# Patient Record
Sex: Male | Born: 1980 | Marital: Married | Smoking: Never smoker
Health system: Southern US, Community
[De-identification: ages and names within clinical notes are randomized; demographics above are authoritative.]

## PROBLEM LIST (undated history)

## (undated) HISTORY — PX: KNEE ARTHROSCOPY W/ ACL RECONSTRUCTION: SHX1858

## (undated) HISTORY — PX: SKIN SURGERY: SHX2413

---

## 2007-03-16 ENCOUNTER — Ambulatory Visit: Payer: Self-pay | Admitting: Unknown Physician Specialty

## 2009-02-24 IMAGING — RF DG UGI W/ KUB
1 series · 15 of 24 positions shown · non-contrast
Comparison: none

REASON FOR EXAM: gas    reflux maneuvers
COMMENTS:

[Series 1: run · 8 acquisitions, 15 frames shown]
[im 1/8]
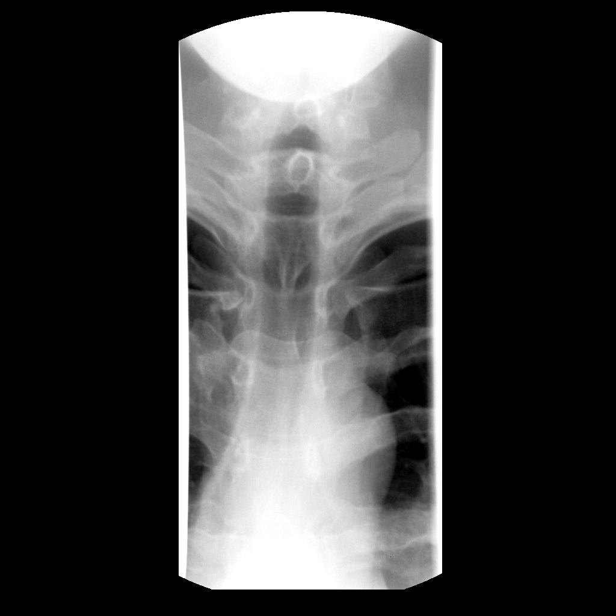
[im 1/8]
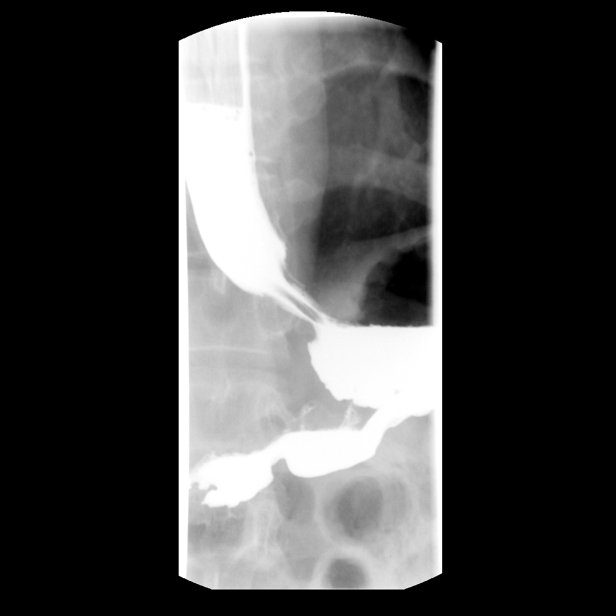
[im 2/8]
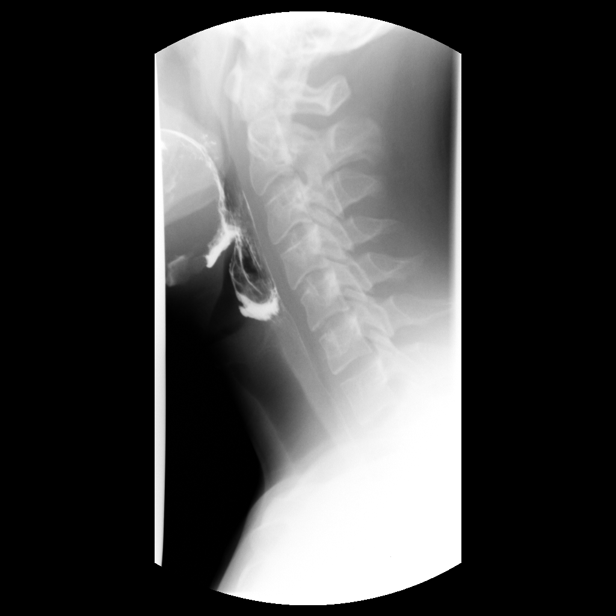
[im 2/8]
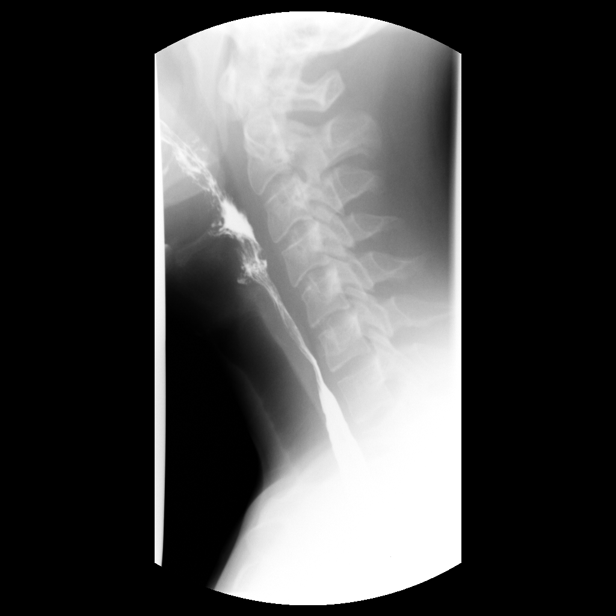
[im 3/8]
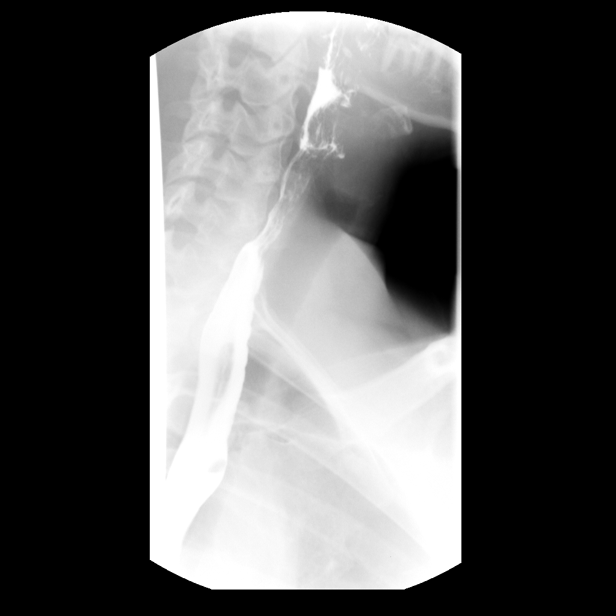
[im 3/8]
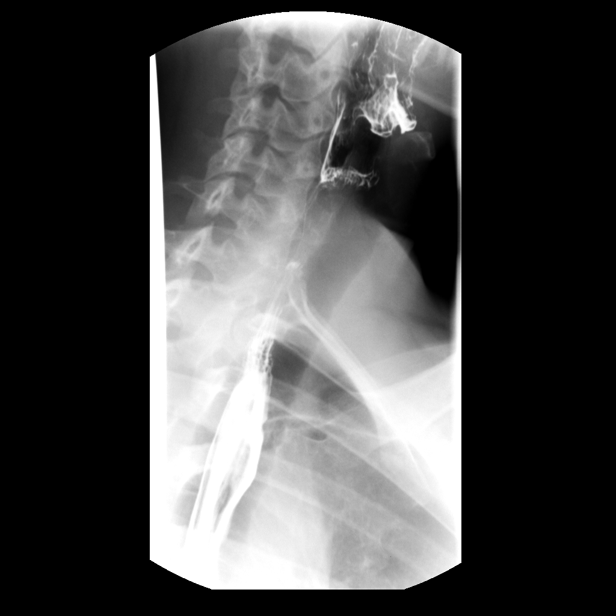
[im 4/8]
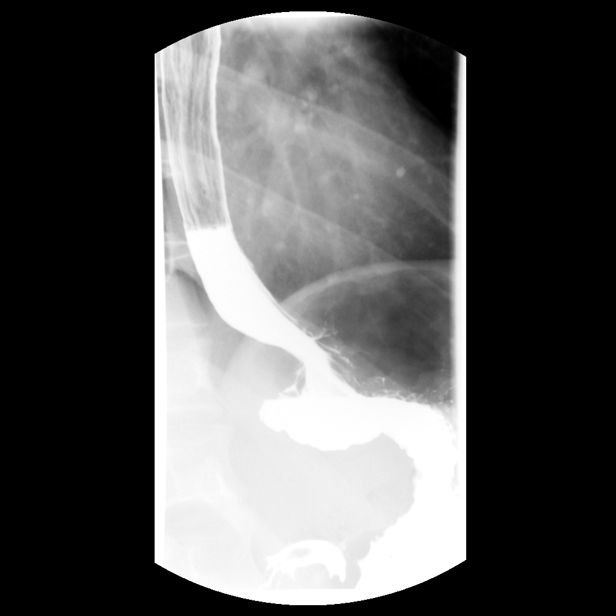
[im 5/8]
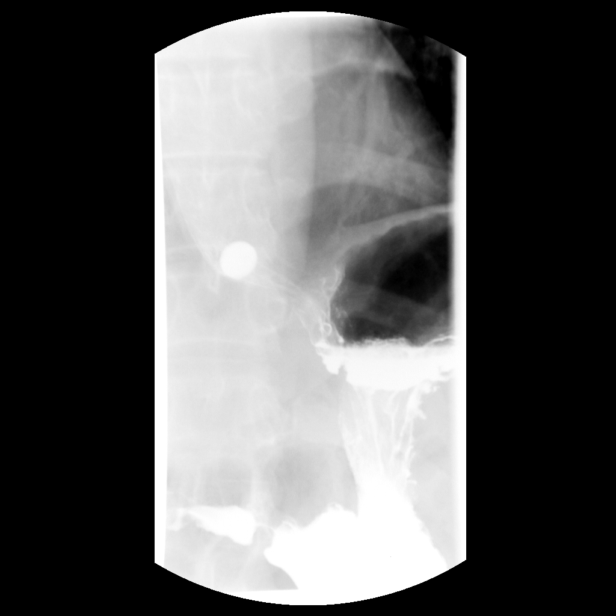
[im 5/8]
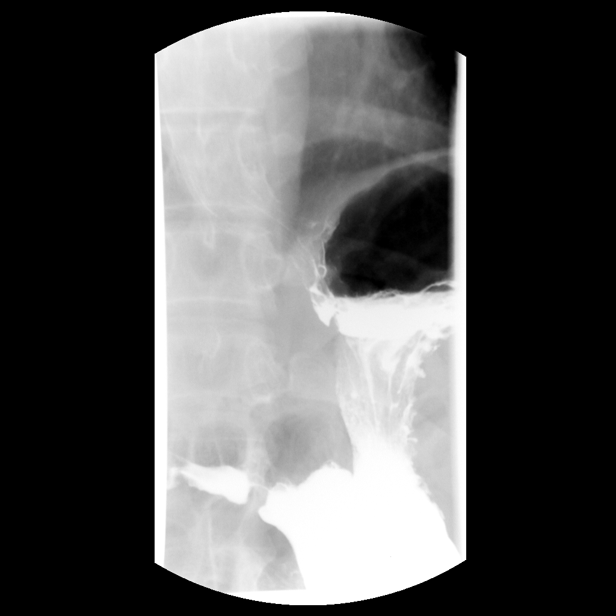
[im 6/8]
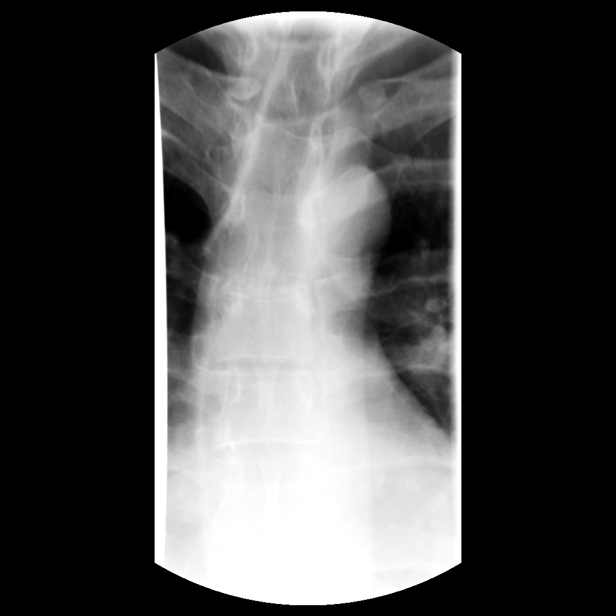
[im 6/8]
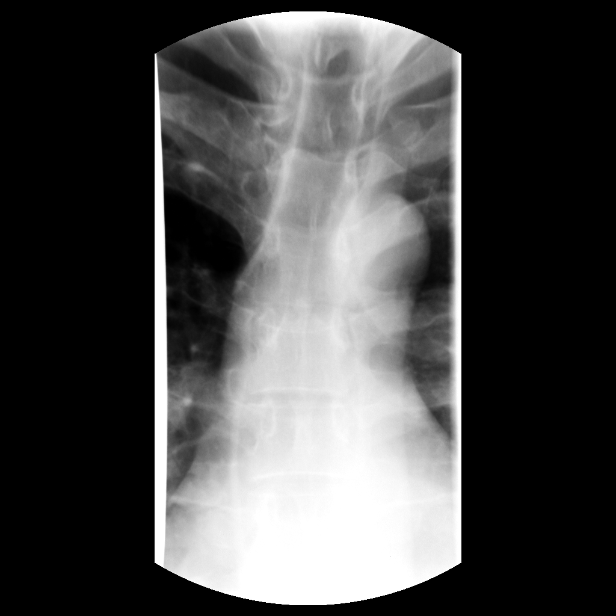
[im 7/8]
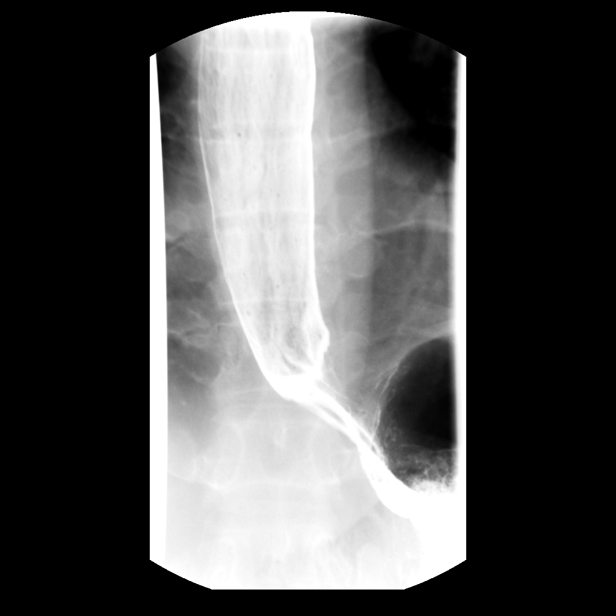
[im 7/8]
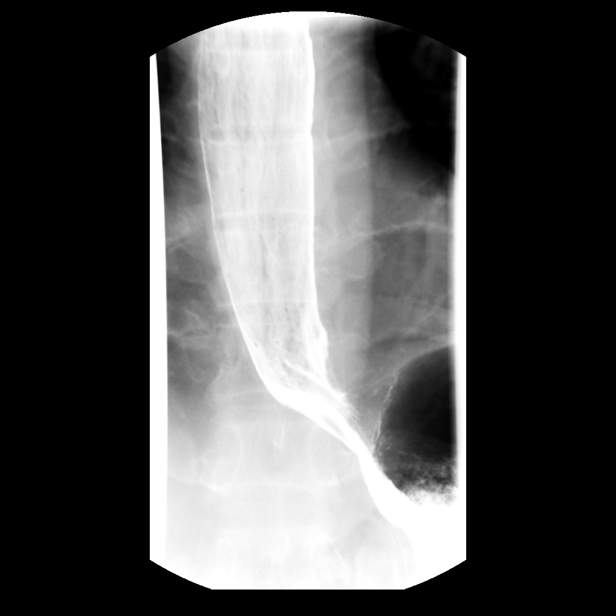
[im 8/8]
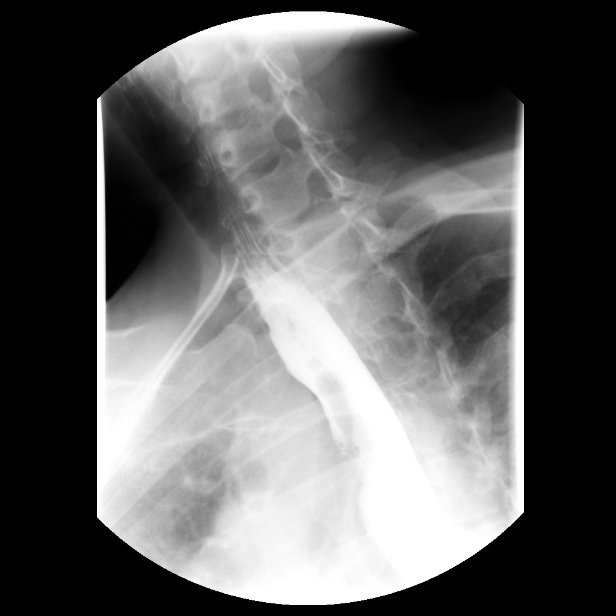
[im 8/8]
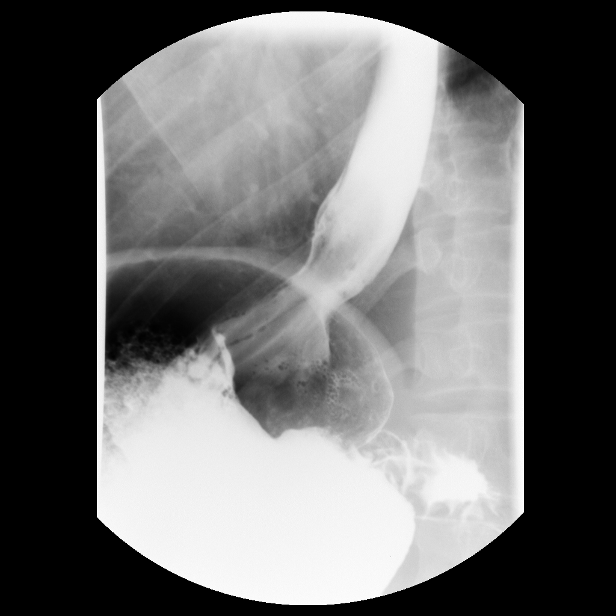

[15 of 24 positions shown; findings below may reference images not displayed]

PROCEDURE:     FL  - FL UPPER GI W/ BARIUM SWALLOW  - March 16, 2007  [DATE]

RESULT:     Routine Upper GI with barium swallow double-contrast examination
shows normal mucosal appearance of the esophagus with a normal morphology.
There is no gastroesophageal reflux. There is no persistent stenosis. A
mm barium impregnated tablet passed easily through the esophagus into the
stomach. No gastroesophageal reflux is evident. The stomach distends fully
and shows no focal abnormality.
IMPRESSION: Unremarkable double-contrast GI with barium swallow. Reflux maneuvers were
performed. No reflux is evident.

## 2011-04-23 ENCOUNTER — Emergency Department: Payer: Self-pay | Admitting: Emergency Medicine

## 2012-01-23 ENCOUNTER — Ambulatory Visit: Payer: Self-pay

## 2017-12-17 ENCOUNTER — Ambulatory Visit (INDEPENDENT_AMBULATORY_CARE_PROVIDER_SITE_OTHER): Payer: 59 | Admitting: Family Medicine

## 2017-12-17 ENCOUNTER — Encounter: Payer: Self-pay | Admitting: Family Medicine

## 2017-12-17 VITALS — BP 126/76 | HR 56 | Temp 98.0°F | Ht 72.4 in | Wt 189.2 lb

## 2017-12-17 DIAGNOSIS — Z0000 Encounter for general adult medical examination without abnormal findings: Secondary | ICD-10-CM | POA: Diagnosis not present

## 2017-12-17 DIAGNOSIS — Z23 Encounter for immunization: Secondary | ICD-10-CM | POA: Diagnosis not present

## 2017-12-17 LAB — UA/M W/RFLX CULTURE, ROUTINE
Bilirubin, UA: NEGATIVE
Glucose, UA: NEGATIVE
Ketones, UA: NEGATIVE
Leukocytes, UA: NEGATIVE
Nitrite, UA: NEGATIVE
PH UA: 5.5 (ref 5.0–7.5)
RBC UA: NEGATIVE
Specific Gravity, UA: >1.03 — ABNORMAL HIGH (ref 1.005–1.030)
Urobilinogen, Ur: 0.2 mg/dL (ref 0.2–1.0)

## 2017-12-17 NOTE — Patient Instructions (Signed)
Health Maintenance, Male A healthy lifestyle and preventive care is important for your health and wellness. Ask your health care provider about what schedule of regular examinations is right for you. What should I know about weight and diet? Eat a Healthy Diet  Eat plenty of vegetables, fruits, whole grains, low-fat dairy products, and lean protein.  Do not eat a lot of foods high in solid fats, added sugars, or salt.  Maintain a Healthy Weight Regular exercise can help you achieve or maintain a healthy weight. You should:  Do at least 150 minutes of exercise each week. The exercise should increase your heart rate and make you sweat (moderate-intensity exercise).  Do strength-training exercises at least twice a week.  Watch Your Levels of Cholesterol and Blood Lipids  Have your blood tested for lipids and cholesterol every 5 years starting at 37 years of age. If you are at high risk for heart disease, you should start having your blood tested when you are 37 years old. You may need to have your cholesterol levels checked more often if: ? Your lipid or cholesterol levels are high. ? You are older than 37 years of age. ? You are at high risk for heart disease.  What should I know about cancer screening? Many types of cancers can be detected early and may often be prevented. Lung Cancer  You should be screened every year for lung cancer if: ? You are a current smoker who has smoked for at least 30 years. ? You are a former smoker who has quit within the past 15 years.  Talk to your health care provider about your screening options, when you should start screening, and how often you should be screened.  Colorectal Cancer  Routine colorectal cancer screening usually begins at 37 years of age and should be repeated every 5-10 years until you are 37 years old. You may need to be screened more often if early forms of precancerous polyps or small growths are found. Your health care provider  may recommend screening at an earlier age if you have risk factors for colon cancer.  Your health care provider may recommend using home test kits to check for hidden blood in the stool.  A small camera at the end of a tube can be used to examine your colon (sigmoidoscopy or colonoscopy). This checks for the earliest forms of colorectal cancer.  Prostate and Testicular Cancer  Depending on your age and overall health, your health care provider may do certain tests to screen for prostate and testicular cancer.  Talk to your health care provider about any symptoms or concerns you have about testicular or prostate cancer.  Skin Cancer  Check your skin from head to toe regularly.  Tell your health care provider about any new moles or changes in moles, especially if: ? There is a change in a mole's size, shape, or color. ? You have a mole that is larger than a pencil eraser.  Always use sunscreen. Apply sunscreen liberally and repeat throughout the day.  Protect yourself by wearing long sleeves, pants, a wide-brimmed hat, and sunglasses when outside.  What should I know about heart disease, diabetes, and high blood pressure?  If you are 18-39 years of age, have your blood pressure checked every 3-5 years. If you are 40 years of age or older, have your blood pressure checked every year. You should have your blood pressure measured twice-once when you are at a hospital or clinic, and once when   you are not at a hospital or clinic. Record the average of the two measurements. To check your blood pressure when you are not at a hospital or clinic, you can use: ? An automated blood pressure machine at a pharmacy. ? A home blood pressure monitor.  Talk to your health care provider about your target blood pressure.  If you are between 45-79 years old, ask your health care provider if you should take aspirin to prevent heart disease.  Have regular diabetes screenings by checking your fasting blood  sugar level. ? If you are at a normal weight and have a low risk for diabetes, have this test once every three years after the age of 45. ? If you are overweight and have a high risk for diabetes, consider being tested at a younger age or more often.  A one-time screening for abdominal aortic aneurysm (AAA) by ultrasound is recommended for men aged 65-75 years who are current or former smokers. What should I know about preventing infection? Hepatitis B If you have a higher risk for hepatitis B, you should be screened for this virus. Talk with your health care provider to find out if you are at risk for hepatitis B infection. Hepatitis C Blood testing is recommended for:  Everyone born from 1945 through 1965.  Anyone with known risk factors for hepatitis C.  Sexually Transmitted Diseases (STDs)  You should be screened each year for STDs including gonorrhea and chlamydia if: ? You are sexually active and are younger than 37 years of age. ? You are older than 37 years of age and your health care provider tells you that you are at risk for this type of infection. ? Your sexual activity has changed since you were last screened and you are at an increased risk for chlamydia or gonorrhea. Ask your health care provider if you are at risk.  Talk with your health care provider about whether you are at high risk of being infected with HIV. Your health care provider may recommend a prescription medicine to help prevent HIV infection.  What else can I do?  Schedule regular health, dental, and eye exams.  Stay current with your vaccines (immunizations).  Do not use any tobacco products, such as cigarettes, chewing tobacco, and e-cigarettes. If you need help quitting, ask your health care provider.  Limit alcohol intake to no more than 2 drinks per day. One drink equals 12 ounces of beer, 5 ounces of wine, or 1 ounces of hard liquor.  Do not use street drugs.  Do not share needles.  Ask your  health care provider for help if you need support or information about quitting drugs.  Tell your health care provider if you often feel depressed.  Tell your health care provider if you have ever been abused or do not feel safe at home. This information is not intended to replace advice given to you by your health care provider. Make sure you discuss any questions you have with your health care provider. Document Released: 01/02/2008 Document Revised: 03/04/2016 Document Reviewed: 04/09/2015 Elsevier Interactive Patient Education  2018 [employer] Tdap Vaccine (Tetanus, Diphtheria and Pertussis): What You Need to Know 1. Why get vaccinated? Tetanus, diphtheria and pertussis are very serious diseases. Tdap vaccine can protect us from these diseases. And, Tdap vaccine given to pregnant women can protect newborn babies against pertussis. TETANUS (Lockjaw) is rare in the [place] today. It causes painful muscle tightening and stiffness, usually all over the body.    It can lead to tightening of muscles in the head and neck so you can't open your mouth, swallow, or sometimes even breathe. Tetanus kills about 1 out of 10 people who are infected even after receiving the best medical care.  DIPHTHERIA is also rare in the [place] today. It can cause a thick coating to form in the back of the throat.  It can lead to breathing problems, heart failure, paralysis, and death.  PERTUSSIS (Whooping Cough) causes severe coughing spells, which can cause difficulty breathing, vomiting and disturbed sleep.  It can also lead to weight loss, incontinence, and rib fractures. Up to 2 in 100 adolescents and 5 in 100 adults with pertussis are hospitalized or have complications, which could include pneumonia or death.  These diseases are caused by bacteria. Diphtheria and pertussis are spread from person to person through secretions from coughing or sneezing. Tetanus enters the body through cuts, scratches,  or wounds. Before vaccines, as many as 200,000 cases of diphtheria, 200,000 cases of pertussis, and hundreds of cases of tetanus, were reported in the [place] each year. Since vaccination began, reports of cases for tetanus and diphtheria have dropped by about 99% and for pertussis by about 80%. 2. Tdap vaccine Tdap vaccine can protect adolescents and adults from tetanus, diphtheria, and pertussis. One dose of Tdap is routinely given at age 11 or 12. People who did not get Tdap at that age should get it as soon as possible. Tdap is especially important for healthcare professionals and anyone having close contact with a baby younger than 12 months. Pregnant women should get a dose of Tdap during every pregnancy, to protect the newborn from pertussis. Infants are most at risk for severe, life-threatening complications from pertussis. Another vaccine, called Td, protects against tetanus and diphtheria, but not pertussis. A Td booster should be given every 10 years. Tdap may be given as one of these boosters if you have never gotten Tdap before. Tdap may also be given after a severe cut or burn to prevent tetanus infection. Your doctor or the person giving you the vaccine can give you more information. Tdap may safely be given at the same time as other vaccines. 3. Some people should not get this vaccine  A person who has ever had a life-threatening allergic reaction after a previous dose of any diphtheria, tetanus or pertussis containing vaccine, OR has a severe allergy to any part of this vaccine, should not get Tdap vaccine. Tell the person giving the vaccine about any severe allergies.  Anyone who had coma or long repeated seizures within 7 days after a childhood dose of DTP or DTaP, or a previous dose of Tdap, should not get Tdap, unless a cause other than the vaccine was found. They can still get Td.  Talk to your doctor if you: ? have seizures or another nervous system problem, ? had severe  pain or swelling after any vaccine containing diphtheria, tetanus or pertussis, ? ever had a condition called Guillain-Barr Syndrome (GBS), ? aren't feeling well on the day the shot is scheduled. 4. Risks With any medicine, including vaccines, there is a chance of side effects. These are usually mild and go away on their own. Serious reactions are also possible but are rare. Most people who get Tdap vaccine do not have any problems with it. Mild problems following Tdap: (Did not interfere with activities)  Pain where the shot was given (about 3 in 4 adolescents or 2 in 3 adults)    Redness or swelling where the shot was given (about 1 person in 5)  Mild fever of at least 100.4F (up to about 1 in 25 adolescents or 1 in 100 adults)  Headache (about 3 or 4 people in 10)  Tiredness (about 1 person in 3 or 4)  Nausea, vomiting, diarrhea, stomach ache (up to 1 in 4 adolescents or 1 in 10 adults)  Chills, sore joints (about 1 person in 10)  Body aches (about 1 person in 3 or 4)  Rash, swollen glands (uncommon)  Moderate problems following Tdap: (Interfered with activities, but did not require medical attention)  Pain where the shot was given (up to 1 in 5 or 6)  Redness or swelling where the shot was given (up to about 1 in 16 adolescents or 1 in 12 adults)  Fever over 102F (about 1 in 100 adolescents or 1 in 250 adults)  Headache (about 1 in 7 adolescents or 1 in 10 adults)  Nausea, vomiting, diarrhea, stomach ache (up to 1 or 3 people in 100)  Swelling of the entire arm where the shot was given (up to about 1 in 500).  Severe problems following Tdap: (Unable to perform usual activities; required medical attention)  Swelling, severe pain, bleeding and redness in the arm where the shot was given (rare).  Problems that could happen after any vaccine:  People sometimes faint after a medical procedure, including vaccination. Sitting or lying down for about 15 minutes can help  prevent fainting, and injuries caused by a fall. Tell your doctor if you feel dizzy, or have vision changes or ringing in the ears.  Some people get severe pain in the shoulder and have difficulty moving the arm where a shot was given. This happens very rarely.  Any medication can cause a severe allergic reaction. Such reactions from a vaccine are very rare, estimated at fewer than 1 in a million doses, and would happen within a few minutes to a few hours after the vaccination. As with any medicine, there is a very remote chance of a vaccine causing a serious injury or death. The safety of vaccines is always being monitored. For more information, visit: www.cdc.gov/vaccinesafety/ 5. What if there is a serious problem? What should I look for? Look for anything that concerns you, such as signs of a severe allergic reaction, very high fever, or unusual behavior. Signs of a severe allergic reaction can include hives, swelling of the face and throat, difficulty breathing, a fast heartbeat, dizziness, and weakness. These would usually start a few minutes to a few hours after the vaccination. What should I do?  If you think it is a severe allergic reaction or other emergency that can't wait, call 9-1-1 or get the person to the nearest hospital. Otherwise, call your doctor.  Afterward, the reaction should be reported to the Vaccine Adverse Event Reporting System (VAERS). Your doctor might file this report, or you can do it yourself through the VAERS web site at www.vaers.hhs.gov, or by calling 1-800-822-7967. ? VAERS does not give medical advice. 6. The National Vaccine Injury Compensation Program The National Vaccine Injury Compensation Program (VICP) is a federal program that was created to compensate people who may have been injured by certain vaccines. Persons who believe they may have been injured by a vaccine can learn about the program and about filing a claim by calling 1-800-338-2382 or visiting  the VICP website at www.hrsa.gov/vaccinecompensation. There is a time limit to file a claim for compensation. 7.   How can I learn more?  Ask your doctor. He or she can give you the vaccine package insert or suggest other sources of information.  Call your local or state health department.  Contact the Centers for Disease Control and Prevention (CDC): ? Call 1-800-232-4636 (1-800-CDC-INFO) or ? Visit CDC's website at www.cdc.gov/vaccines CDC Tdap Vaccine VIS (09/12/13) This information is not intended to replace advice given to you by your health care provider. Make sure you discuss any questions you have with your health care provider. Document Released: 01/05/2012 Document Revised: 03/26/2016 Document Reviewed: 03/26/2016 Elsevier Interactive Patient Education  2017 Elsevier Inc.  

## 2017-12-17 NOTE — Progress Notes (Signed)
BP 126/76 (BP Location: Right Arm, Patient Position: Sitting, Cuff Size: Normal)   Pulse (!) 56   Temp 98 F (36.7 C)   Ht 6' 0.4" (1.839 m)   Wt 189 lb 4 oz (85.8 kg)   SpO2 98%   BMI 25.38 kg/m    Subjective:    Patient ID: Corey Nixon, male    DOB: [birth year], 37 y.o.   MRN: [id number]  HPI: Corey Nixon is a 37 y.o. male presenting on 12/17/2017 for comprehensive medical examination. Current medical complaints include:none  Interim Problems from his last visit: no  Depression Screen done today and results listed below:  Depression screen [facility] 2/9 12/17/2017  Decreased Interest 0  Down, Depressed, Hopeless 0  PHQ - 2 Score 0  Altered sleeping 0  Tired, decreased energy 0  Change in appetite 0  Feeling bad or failure about yourself  0  Trouble concentrating 0  Moving slowly or fidgety/restless 0  Suicidal thoughts 0  PHQ-9 Score 0     Past Medical History:  History reviewed. No pertinent past medical history.  Surgical History:  Past Surgical History:  Procedure Laterality Date  . KNEE ARTHROSCOPY W/ ACL RECONSTRUCTION Right   . SKIN SURGERY     removal of precancerous lesions    Medications:  No current outpatient medications on file prior to visit.   No current facility-administered medications on file prior to visit.     Allergies:  No Known Allergies  Social History:  Social History   Socioeconomic History  . Marital status: Married    Spouse name: Not on file  . Number of children: Not on file  . Years of education: Not on file  . Highest education level: Not on file  Occupational History  . Not on file  Social Needs  . Financial resource strain: Not on file  . Food insecurity:    Worry: Not on file    Inability: Not on file  . Transportation needs:    Medical: Not on file    Non-medical: Not on file  Tobacco Use  . Smoking status: Never Smoker  . Smokeless tobacco: Never Used  Substance and Sexual Activity  .  Alcohol use: Yes    Alcohol/week: 1.2 oz    Types: 2 Cans of beer per week  . Drug use: Never  . Sexual activity: Yes    Birth control/protection: Surgical  Lifestyle  . Physical activity:    Days per week: Not on file    Minutes per session: Not on file  . Stress: Not on file  Relationships  . Social connections:    Talks on phone: Not on file    Gets together: Not on file    Attends religious service: Not on file    Active member of club or organization: Not on file    Attends meetings of clubs or organizations: Not on file    Relationship status: Not on file  . Intimate partner violence:    Fear of current or ex partner: Not on file    Emotionally abused: Not on file    Physically abused: Not on file    Forced sexual activity: Not on file  Other Topics Concern  . Not on file  Social History Narrative  . Not on file   Social History   Tobacco Use  Smoking Status Never Smoker  Smokeless Tobacco Never Used   Social History   Substance and Sexual Activity  Alcohol Use  Yes  . Alcohol/week: 1.2 oz  . Types: 2 Cans of beer per week    Family History:  Family History  Problem Relation Age of Onset  . Cancer Maternal Grandfather        Heart    Past medical history, surgical history, medications, allergies, family history and social history reviewed with patient today and changes made to appropriate areas of the chart.   Review of Systems  Constitutional: Negative.   HENT: Negative.   Eyes: Negative.   Respiratory: Negative.   Cardiovascular: Negative.   Gastrointestinal: Negative.   Genitourinary: Negative.   Musculoskeletal: Negative.   Skin: Negative.   Neurological: Negative.   Endo/Heme/Allergies: Negative.   Psychiatric/Behavioral: Negative.     All other ROS negative except what is listed above and in the HPI.      Objective:    BP 126/76 (BP Location: Right Arm, Patient Position: Sitting, Cuff Size: Normal)   Pulse (!) 56   Temp 98 F (36.7  C)   Ht 6' 0.4" (1.839 m)   Wt 189 lb 4 oz (85.8 kg)   SpO2 98%   BMI 25.38 kg/m   Wt Readings from Last 3 Encounters:  12/17/17 189 lb 4 oz (85.8 kg)    Physical Exam  Constitutional: He is oriented to person, place, and time. He appears well-developed and well-nourished. No distress.  HENT:  Head: Normocephalic and atraumatic.  Right Ear: Hearing, tympanic membrane, external ear and ear canal normal.  Left Ear: Hearing, tympanic membrane, external ear and ear canal normal.  Nose: Nose normal.  Mouth/Throat: Uvula is midline, oropharynx is clear and moist and mucous membranes are normal. No oropharyngeal exudate.  Eyes: Pupils are equal, round, and reactive to light. Conjunctivae, EOM and lids are normal. Right eye exhibits no discharge. Left eye exhibits no discharge. No scleral icterus.  Neck: Normal range of motion. Neck supple. No JVD present. No tracheal deviation present. No thyromegaly present.  Cardiovascular: Normal rate, regular rhythm, normal heart sounds and intact distal pulses. Exam reveals no gallop and no friction rub.  No murmur heard. Pulmonary/Chest: Effort normal and breath sounds normal. No stridor. No respiratory distress. He has no wheezes. He has no rales. He exhibits no tenderness.  Abdominal: Soft. Bowel sounds are normal. He exhibits no distension and no mass. There is no tenderness. There is no rebound and no guarding. No hernia.  Musculoskeletal: Normal range of motion. He exhibits no edema, tenderness or deformity.  Lymphadenopathy:    He has no cervical adenopathy.  Neurological: He is alert and oriented to person, place, and time. He displays normal reflexes. No cranial nerve deficit or sensory deficit. He exhibits normal muscle tone. Coordination normal.  Skin: Skin is warm, dry and intact. Capillary refill takes less than 2 seconds. No rash noted. He is not diaphoretic. No erythema. No pallor.  Psychiatric: He has a normal mood and affect. His speech is  normal and behavior is normal. Judgment and thought content normal. Cognition and memory are normal.  Nursing note and vitals reviewed.   No results found for this or any previous visit.    Assessment & Plan:   Problem List Items Addressed This Visit    None    Visit Diagnoses    Routine general medical examination at a health care facility    -  Primary   Vaccines updated. Screening labs drawn today. Continue diet and exercise. Call with any concerns.    Relevant Orders  CBC with Differential/Platelet   Comprehensive metabolic panel   Lipid Panel w/o Chol/HDL Ratio   TSH   UA/M w/rflx Culture, Routine   Immunization due       Tdap given today.   Relevant Orders   Tdap vaccine greater than or equal to 7yo IM       LABORATORY TESTING:  Health maintenance labs ordered today as discussed above.   IMMUNIZATIONS:   - Tdap: Tetanus vaccination status reviewed: Tdap vaccination indicated and given today. - Influenza: Postponed to flu season - Pneumovax: Not applicable  PATIENT COUNSELING:    Sexuality: Discussed sexually transmitted diseases, partner selection, use of condoms, avoidance of unintended pregnancy  and contraceptive alternatives.   Advised to avoid cigarette smoking.  I discussed with the patient that most people either abstain from alcohol or drink within safe limits (<=14/week and <=4 drinks/occasion for males, <=7/weeks and <= 3 drinks/occasion for females) and that the risk for alcohol disorders and other health effects rises proportionally with the number of drinks per week and how often a drinker exceeds daily limits.  Discussed cessation/primary prevention of drug use and availability of treatment for abuse.   Diet: Encouraged to adjust caloric intake to maintain  or achieve ideal body weight, to reduce intake of dietary saturated fat and total fat, to limit sodium intake by avoiding high sodium foods and not adding table salt, and to maintain adequate  dietary potassium and calcium preferably from fresh fruits, vegetables, and low-fat dairy products.    stressed the importance of regular exercise  Injury prevention: Discussed safety belts, safety helmets, smoke detector, smoking near bedding or upholstery.   Dental health: Discussed importance of regular tooth brushing, flossing, and dental visits.   Follow up plan: NEXT PREVENTATIVE PHYSICAL DUE IN 1 YEAR. Return in about 1 year (around 12/18/2018) for Physical.

## 2017-12-18 ENCOUNTER — Encounter: Payer: Self-pay | Admitting: Family Medicine

## 2017-12-18 LAB — CBC WITH DIFFERENTIAL/PLATELET
BASOS ABS: 0 10*3/uL (ref 0.0–0.2)
Basos: 1 %
EOS (ABSOLUTE): 0.4 10*3/uL (ref 0.0–0.4)
Eos: 11 %
Hematocrit: 40.6 % (ref 37.5–51.0)
Hemoglobin: 12.9 g/dL — ABNORMAL LOW (ref 13.0–17.7)
IMMATURE GRANS (ABS): 0 10*3/uL (ref 0.0–0.1)
Immature Granulocytes: 0 %
LYMPHS: 42 %
Lymphocytes Absolute: 1.4 10*3/uL (ref 0.7–3.1)
MCH: 27.7 pg (ref 26.6–33.0)
MCHC: 31.8 g/dL (ref 31.5–35.7)
MCV: 87 fL (ref 79–97)
Monocytes Absolute: 0.2 10*3/uL (ref 0.1–0.9)
Monocytes: 7 %
Neutrophils Absolute: 1.3 10*3/uL — ABNORMAL LOW (ref 1.4–7.0)
Neutrophils: 39 %
PLATELETS: 236 10*3/uL (ref 150–450)
RBC: 4.65 x10E6/uL (ref 4.14–5.80)
RDW: 13.6 % (ref 12.3–15.4)
WBC: 3.3 10*3/uL — ABNORMAL LOW (ref 3.4–10.8)

## 2017-12-18 LAB — LIPID PANEL W/O CHOL/HDL RATIO
Cholesterol, Total: 187 mg/dL (ref 100–199)
HDL: 62 mg/dL (ref >39)
LDL Calculated: 113 mg/dL — ABNORMAL HIGH (ref 0–99)
Triglycerides: 59 mg/dL (ref 0–149)
VLDL Cholesterol Cal: 12 mg/dL (ref 5–40)

## 2017-12-18 LAB — COMPREHENSIVE METABOLIC PANEL
ALT: 13 IU/L (ref 0–44)
AST: 15 IU/L (ref 0–40)
Albumin/Globulin Ratio: 2.3 — ABNORMAL HIGH (ref 1.2–2.2)
Albumin: 4.3 g/dL (ref 3.5–5.5)
Alkaline Phosphatase: 42 IU/L (ref 39–117)
BUN/Creatinine Ratio: 14 (ref 9–20)
BUN: 14 mg/dL (ref 6–20)
Bilirubin Total: 0.5 mg/dL (ref 0.0–1.2)
CO2: 21 mmol/L (ref 20–29)
Calcium: 8.9 mg/dL (ref 8.7–10.2)
Chloride: 105 mmol/L (ref 96–106)
Creatinine, Ser: 1.03 mg/dL (ref 0.76–1.27)
GFR, EST AFRICAN AMERICAN: 107 mL/min/{1.73_m2} (ref >59)
GFR, EST NON AFRICAN AMERICAN: 92 mL/min/{1.73_m2} (ref >59)
GLUCOSE: 76 mg/dL (ref 65–99)
Globulin, Total: 1.9 g/dL (ref 1.5–4.5)
Potassium: 4.3 mmol/L (ref 3.5–5.2)
Sodium: 141 mmol/L (ref 134–144)
TOTAL PROTEIN: 6.2 g/dL (ref 6.0–8.5)

## 2017-12-18 LAB — TSH: TSH: 1.31 u[IU]/mL (ref 0.450–4.500)
